# Patient Record
Sex: Female | Born: 2000 | Race: White | Hispanic: No | Marital: Single | Smoking: Never smoker
Health system: Southern US, Community
[De-identification: ages and names within clinical notes are randomized; demographics above are authoritative.]

## PROBLEM LIST (undated history)

## (undated) DIAGNOSIS — F909 Attention-deficit hyperactivity disorder, unspecified type: Secondary | ICD-10-CM

## (undated) DIAGNOSIS — F419 Anxiety disorder, unspecified: Secondary | ICD-10-CM

## (undated) DIAGNOSIS — J45909 Unspecified asthma, uncomplicated: Secondary | ICD-10-CM

## (undated) HISTORY — DX: Unspecified asthma, uncomplicated: J45.909

## (undated) HISTORY — DX: Attention-deficit hyperactivity disorder, unspecified type: F90.9

## (undated) HISTORY — DX: Anxiety disorder, unspecified: F41.9

---
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
## 2021-01-10 ENCOUNTER — Ambulatory Visit: Payer: Self-pay

## 2021-01-27 ENCOUNTER — Ambulatory Visit: Payer: Self-pay

## 2021-03-29 ENCOUNTER — Other Ambulatory Visit: Payer: Self-pay

## 2021-03-29 ENCOUNTER — Encounter: Payer: Self-pay | Admitting: Licensed Clinical Social Worker

## 2021-03-29 ENCOUNTER — Ambulatory Visit (LOCAL_COMMUNITY_HEALTH_CENTER): Payer: Medicaid Other

## 2021-03-29 VITALS — BP 137/76 | Ht 63.5 in | Wt 126.5 lb

## 2021-03-29 DIAGNOSIS — Z3201 Encounter for pregnancy test, result positive: Secondary | ICD-10-CM | POA: Diagnosis not present

## 2021-03-29 LAB — PREGNANCY, URINE: Preg Test, Ur: POSITIVE — AB

## 2021-03-29 MED ORDER — PRENATAL 27-0.8 MG PO TABS: 1.0000 | ORAL_TABLET | Freq: Every day | ORAL | 0 refills | Status: AC

## 2021-03-29 NOTE — Progress Notes (Signed)
UPT positive. Plans prenatal care at [facility], has appt 04/12/2021.  PHQ 15 today. Reports hx depression, anxiety. Denies hurting self/others. Reports in safe situation. Consult with Hazeline Junker, LCSW who made face to face introduction with pt today. Accepted contact info for A. Mariana Kaufman, LCSW. To DSS for Med. Preg W. Jerel Shepherd, RN

## 2021-03-29 NOTE — Progress Notes (Signed)
Patient referred by clinic nurse Miles Costain do to positive PHQ, no SI Hayley Myers request LCSW to introduce herself and explain available services. LCSW met with patient with patient's verbal consent. LCSW shared information about [facility] services and provided patient with contact informaiton. LCSW encouraged patient to call if she desired to schedule an appointment. Patient voiced appreciation.

## 2021-04-05 ENCOUNTER — Ambulatory Visit: Payer: Medicaid Other | Admitting: Licensed Clinical Social Worker

## 2021-04-05 NOTE — Progress Notes (Deleted)
Counselor Initial Adult Exam  Name: Hayley Myers Date: 04/05/2021 MRN: [id number] DOB: [birth year] PCP: Hayley Myers  A biopsychosocial was completed on the Patient. Background information and current concerns were obtained during an intake in the office with the social work intern, Hayley Myers alongside the [facility] Department clinician, Hayley Cosier, LCSW.  Contact information and confidentiality was discussed and appropriate consents were signed.      Reason for Visit /Presenting Problem: ***  Mental Status Exam:   Appearance:   {PSY:22683}     Behavior:  {PSY:21022743}  Motor:  {PSY:22302}  Speech/Language:   {PSY:22685}  Affect:  {PSY:22687}  Mood:  {PSY:31886}  Thought process:  {PSY:31888}  Thought content:    {PSY:[phone]}  Sensory/Perceptual disturbances:    {[phone]}  Orientation:  {PSY:30297}  Attention:  {PSY:22877}  Concentration:  {PSY:360-770-5720}  Memory:  {PSY:330-853-0231}  Fund of knowledge:   {PSY:360-770-5720}  Insight:    {PSY:360-770-5720}  Judgment:   {PSY:360-770-5720}  Impulse Control:  {PSY:360-770-5720}   Reported Symptoms:  {PSY:260-393-6167}  Risk Assessment: Danger to Self:  {PSY:22692} Self-injurious Behavior: {PSY:22692} Danger to Others: {PSY:22692} Duty to Warn:{PSY:311194} Physical Aggression / Violence:{PSY:21197} Access to Firearms a concern: {PSY:21197} Gang Involvement:{PSY:21197} Patient / guardian was educated about steps to take if suicide or homicide risk level increases between visits: {Yes/No-Ex:120004} While future psychiatric events cannot be accurately predicted, the patient does not currently require acute inpatient psychiatric care and does not currently meet [facility] involuntary commitment criteria.  Substance Abuse History: Current substance abuse: {PSY:21197}    Past Psychiatric History:   {Past psych history:20559} Outpatient Providers:*** History of Psych Hospitalization:  {PSY:21197} Psychological Testing: {PSY:21014032}   Abuse History: Victim of {Abuse History:314532}, {Type of abuse:20566}   Report needed: {PSY:314532} Victim of Neglect:{yes no:314532} Perpetrator of {PSY:20566}  Witness / Exposure to Domestic Violence: {PSY:21197}  Protective Services Involvement: {PSY:21197} Witness to [employer] Violence:  {PSY:21197}  Family History:  Family History  Problem Relation Age of Onset  . Heart disease Father   . Hypertension Father   . Heart disease Maternal Grandfather     Social History:  Social History   Socioeconomic History  . Marital status: Single    Spouse name: Not on file  . Number of children: Not on file  . Years of education: Not on file  . Highest education level: Not on file  Occupational History  . Not on file  Tobacco Use  . Smoking status: Never Smoker  . Smokeless tobacco: Never Used  Vaping Use  . Vaping Use: Every day  . Substances: Nicotine  Substance and Sexual Activity  . Alcohol use: Not Currently    Comment: last use- 03/25/21- "cup"   . Drug use: Yes    Types: Marijuana    Comment: once every week.  Marland Kitchen Sexual activity: Yes    Comment: hx depo- wt loss  Other Topics Concern  . Not on file  Social History Narrative   ** Merged History Encounter **       Social Determinants of Health   Financial Resource Strain: Not on file  Food Insecurity: Not on file  Transportation Needs: Not on file  Physical Activity: Not on file  Stress: Not on file  Social Connections: Not on file    Living situation: the patient {lives:315711::"lives with their family"}  Sexual Orientation:  {Sexual Orientation:[phone]}  Relationship Status: {Desc; marital status:62}  Name of spouse / other:***             If  a parent, number of children / ages:***  Support Systems; {DIABETES JQZESPQ:33007}  Financial Stress:  {YES/NO:21197}  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  Educational History: Education: {PSY :31912}  Religion/Sprituality/World View:   {CHL AMB RELIGION/SPIRITUALITY:[phone]}  Any cultural differences that may affect / interfere with treatment:  {Religious/Cultural:200019}  Recreation/Hobbies: {Woc hobbies:30428}  Stressors:{PATIENT STRESSORS:22669}  Strengths:  {Patient Coping Strengths:[phone]}  Barriers:  ***   Legal History: Pending legal issue / charges: {PSY:20588} History of legal issue / charges: {Legal [phone]}  Medical History/Surgical History:{Desc; reviewed/not reviewed:60074} Past Medical History:  Diagnosis Date  . ADHD   . Anxiety   . Asthma     No past surgical history on file.  Medications: Current Outpatient Medications  Medication Sig Dispense Refill  . cephALEXin (KEFLEX) 500 MG capsule Take 1 capsule (500 mg total) by mouth 3 (three) times daily. (Patient not taking: Reported on 03/29/2021) 21 capsule 0  . predniSONE (STERAPRED UNI-PAK 21 TAB) 10 MG (21) TBPK tablet Take 6 pills on day one then decrease by 1 pill each day (Patient not taking: Reported on 03/29/2021) 21 tablet 0  . Prenatal Vit-Fe Fumarate-FA (MULTIVITAMIN-PRENATAL) 27-0.8 MG TABS tablet Take 1 tablet by mouth daily at 12 noon. 100 tablet 0  . traMADol (ULTRAM) 50 MG tablet Take 1 tablet (50 mg total) by mouth every 6 (six) hours as needed. (Patient not taking: Reported on 03/29/2021) 15 tablet 0   No current facility-administered medications for this visit.    Allergies  Allergen Reactions  . Pollen Extract Hives and Shortness Of Breath   Hayley Myers is a 20 y.o. year old female  with a reported history of diagnoses of. Patient currently presents with **** that she reports she has experienced for a 19 time. Patient currently describes both depressive symptoms and anxiety symptoms. She reports significant *** symptoms, including ***. Although patient endorses these vague suicidal ideations, she denies any current  plan, intent, or means to harm herself. She also describes ***. Patient reports that these symptoms significantly impact her functioning in multiple life domains.   Due to the above symptoms and patient's reported history, patient is diagnosed with Major Depressive Disorder, recurrent episode, Moderate and Generalized Anxiety Disorder, With panic attacks. Patient's mood symptoms should continue to be monitored closely to provide further diagnosis clarification. Continued mental health treatment is needed to address patient's symptoms and monitor her safety and stability. Patient is recommended for psychiatric medication management evaluation and continued outpatient therapy to further reduce her symptoms and improve her coping strategies.    There is no acute risk for suicide or violence at this time.  While future psychiatric events cannot be accurately predicted, the patient does not require acute inpatient psychiatric care and does not currently meet [facility] involuntary commitment criteria.      Diagnoses:  No diagnosis found.  Plan of Care: ***  Patient's goal of treatment is   -LCSW provided psychoeducation on -LCSW and patient agreed to develop a treatment plan at next session    Cline Cools

## 2021-04-11 ENCOUNTER — Ambulatory Visit: Payer: Medicaid Other | Admitting: Licensed Clinical Social Worker

## 2021-04-11 ENCOUNTER — Encounter: Payer: Self-pay | Admitting: Licensed Clinical Social Worker

## 2021-04-11 DIAGNOSIS — F4323 Adjustment disorder with mixed anxiety and depressed mood: Secondary | ICD-10-CM
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
## 2021-04-18 ENCOUNTER — Ambulatory Visit: Payer: No Typology Code available for payment source | Admitting: Licensed Clinical Social Worker

## 2021-04-18 DIAGNOSIS — F4323 Adjustment disorder with mixed anxiety and depressed mood: Secondary | ICD-10-CM
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
## 2021-04-24 ENCOUNTER — Ambulatory Visit: Payer: No Typology Code available for payment source | Admitting: Licensed Clinical Social Worker

## 2021-04-24 NOTE — Progress Notes (Unsigned)
Counselor/Therapist Progress Note  Patient ID: ANGALENA COUSINEAU, MRN: [id number],    Date: 04/24/2021  Time Spent: ***   Treatment Type: {CHL AMB THERAPY [phone]}  Reported Symptoms: {CHL AMB Reported Symptoms:[phone]}  Mental Status Exam:  Appearance:   {PSY:22683}     Behavior:  {PSY:21022743}  Motor:  {PSY:22302}  Speech/Language:   {PSY:22685}  Affect:  {PSY:22687}  Mood:  {PSY:31886}  Thought process:  {PSY:31888}  Thought content:    {PSY:442 604 3471}  Sensory/Perceptual disturbances:    {PSY:[phone]}  Orientation:  {PSY:30297}  Attention:  {PSY:22877}  Concentration:  {[phone]}  Memory:  {[phone]}  Fund of knowledge:   {[phone]}  Insight:    {[phone]}  Judgment:   {[phone]}  Impulse Control:  {[phone]}   Risk Assessment: Danger to Self:  {PSY:22692} Self-injurious Behavior: {PSY:22692} Danger to Others: {PSY:22692} Duty to Warn:{PSY:311194} Physical Aggression / Violence:{PSY:21197} Access to Firearms a concern: {PSY:21197} Gang Involvement:{PSY:21197}  Subjective: Patient was engaged and cooperative throughout the session using time effectively to discuss   Patient voices continued motivation for treatment and understanding of  . Patient is likely to benefit from future treatment because  remains motivated to decrease  And   and reports benefit of regular sessions in addressing these symptoms.   Interventions: {[phone]} Established psychological safety.    Diagnosis:   ICD-10-CM   1. Adjustment disorder with mixed anxiety and depressed mood  F43.23     Plan: Patient's goal of treatment is to be less depressed, less anxiety, to set goals in life. Treatment Target: Understand the relationship between thoughts, emotions, and behaviors   Psychoeducation on CBT model    Teach the connection between thoughts, emotions, and behaviors   Enhance emotional awareness and discrimination of emotions   Treatment Target: Increase realistic balanced thinking   Explore patient's thoughts, beliefs, automatic thoughts, assumptions   Identify hot thoughts(upsetting ideas, self-talk and mental images)  Identify and replace thinking that leads to depression  Process distress and allow for emotional release   Cognitive reframing   Questioning and challenging thoughts  Provided psychoeducation on core beliefs, explore, and assist patient in identifying core beliefs and Modify underlying beliefs  Treatment Target: Reducing vulnerability to "emotional mind" and Increase emotional regulation   Values clarification    Self-care - nutrition, sleep, exercise   Increase positive events  ? Activity planning   Coping skills to manage anxiety    Teach distress tolerance techniques - "what helps me"  Treatment Target: Increase awareness of healthy relationships  Provide psychoeducation on healthy relationships   Assist patient in identifying what a healthy relationship is   Future Appointments  Date Time Provider Department Center  04/24/2021  3:00 PM Kathreen Cosier, LCSW [facility] None   Kathreen Cosier, LCSW

## 2021-05-11 LAB — OB RESULTS CONSOLE RUBELLA ANTIBODY, IGM: Rubella: IMMUNE

## 2021-05-11 LAB — OB RESULTS CONSOLE HEPATITIS B SURFACE ANTIGEN: Hepatitis B Surface Ag: NEGATIVE

## 2021-05-11 LAB — OB RESULTS CONSOLE VARICELLA ZOSTER ANTIBODY, IGG: Varicella: IMMUNE

## 2021-06-14 LAB — OB RESULTS CONSOLE GBS: GBS: NEGATIVE

## 2021-07-12 ENCOUNTER — Other Ambulatory Visit: Payer: Self-pay

## 2021-07-12 ENCOUNTER — Encounter: Payer: Self-pay | Admitting: Obstetrics and Gynecology

## 2021-07-12 ENCOUNTER — Observation Stay
Admission: EM | Admit: 2021-07-12 | Discharge: 2021-07-12 | Disposition: A | Discharge status: Home / Self Care | Payer: Medicaid Other | Attending: Obstetrics and Gynecology | Admitting: Obstetrics and Gynecology

## 2021-07-12 DIAGNOSIS — Z3490 Encounter for supervision of normal pregnancy, unspecified, unspecified trimester: Secondary | ICD-10-CM

## 2021-07-12 DIAGNOSIS — Z3A22 22 weeks gestation of pregnancy: Secondary | ICD-10-CM | POA: Diagnosis not present

## 2021-07-12 DIAGNOSIS — O99512 Diseases of the respiratory system complicating pregnancy, second trimester: Secondary | ICD-10-CM | POA: Diagnosis not present

## 2021-07-12 DIAGNOSIS — J45909 Unspecified asthma, uncomplicated: Secondary | ICD-10-CM | POA: Diagnosis not present

## 2021-07-12 LAB — URINALYSIS, COMPLETE (UACMP) WITH MICROSCOPIC
Bilirubin Urine: NEGATIVE
Glucose, UA: NEGATIVE mg/dL
Hgb urine dipstick: NEGATIVE
Ketones, ur: NEGATIVE mg/dL
Nitrite: NEGATIVE
Protein, ur: NEGATIVE mg/dL
Specific Gravity, Urine: 1.013 (ref 1.005–1.030)
pH: 6 (ref 5.0–8.0)

## 2021-07-12 LAB — WET PREP, GENITAL
Clue Cells Wet Prep HPF POC: NONE SEEN
Sperm: NONE SEEN
Trich, Wet Prep: NONE SEEN

## 2021-07-12 MED ORDER — CLOTRIMAZOLE 1 % VA CREA
1.0000 | TOPICAL_CREAM | Freq: Every day | VAGINAL | Status: DC
  Filled 2021-07-12: qty 45

## 2021-07-12 MED ORDER — FLUCONAZOLE 50 MG PO TABS: 150.0000 mg | ORAL_TABLET | Freq: Once | ORAL | Status: DC

## 2021-07-12 NOTE — OB Triage Note (Signed)
Pt. reports to Labor & Delivery for lower right abdominal pain since late this afternoon. She describes the pain as cramping and rates it a 10/10. She also complains of nausea and diarrhea since this morning, as well as being more hot-natured. Vital signs WNL. FHT obtained at 143bpm. Will continue to assess.

## 2021-07-12 NOTE — Discharge Summary (Signed)
TRIAGE NOTE to rule out Preterm Labor   History of Present Illness: Hayley Myers is a 20 y.o. G1P0000 at [redacted]w[redacted]d presenting to triage for preterm pain.  RLQ pain 5/10-10/10 when it happens, with nausea- no vomiting- and watery diarrhea this morning but not since. No fever. No new contacts or ingestions.  Good fetal movement No vaginal discharge or fluid She wouldn't have come but her grandmother wanted her to be checked out  Patient Active Problem List   Diagnosis Date Noted   Asthma 01/14/2019   History of Kawasaki's disease 10/12/2015    Past Medical History:  Diagnosis Date   ADHD    Anxiety    Asthma     History reviewed. No pertinent surgical history.  OB History  Gravida Para Term Preterm AB Living  1 0 0 0 0    SAB IAB Ectopic Multiple Live Births  0 0 0        # Outcome Date GA Lbr Len/2nd Weight Sex Delivery Anes PTL Lv  1 Current             Social History   Socioeconomic History   Marital status: Single    Spouse name: Not on file   Number of children: Not on file   Years of education: 9   Highest education level: 9th grade  Occupational History   Not on file  Tobacco Use   Smoking status: Never   Smokeless tobacco: Never  Vaping Use   Vaping Use: Every day  Substance and Sexual Activity   Alcohol use: Not Currently    Comment: last use- 03/25/21- "cup"    Drug use: Not Currently    Types: Marijuana    Comment: last use 2 years ago    Sexual activity: Yes    Birth control/protection: Injection    Comment: hx depo- wt loss  Other Topics Concern   Not on file  Social History Narrative   Patient is currently pregnant with EDD 10/2021. She reports living in between her maternal grandmother and father of boyfriend. She reports lack of family and social support and a history of childhood instability.        Social Determinants of Health   Financial Resource Strain: Not on file  Food Insecurity: Not on file  Transportation Needs: Not on file   Physical Activity: Not on file  Stress: Not on file  Social Connections: Not on file    Family History  Problem Relation Age of Onset   Heart disease Father    Hypertension Father    Heart disease Maternal Grandfather     Allergies  Allergen Reactions   Pollen Extract Hives and Shortness Of Breath    Medications Prior to Admission  Medication Sig Dispense Refill Last Dose   cephALEXin (KEFLEX) 500 MG capsule Take 1 capsule (500 mg total) by mouth 3 (three) times daily. (Patient not taking: Reported on 03/29/2021) 21 capsule 0    predniSONE (STERAPRED UNI-PAK 21 TAB) 10 MG (21) TBPK tablet Take 6 pills on day one then decrease by 1 pill each day (Patient not taking: Reported on 03/29/2021) 21 tablet 0    traMADol (ULTRAM) 50 MG tablet Take 1 tablet (50 mg total) by mouth every 6 (six) hours as needed. (Patient not taking: Reported on 03/29/2021) 15 tablet 0     Review of Systems - See HPI for OB specific ROS.   Vitals:  BP 114/66 (BP Location: Left Arm)   Pulse 98  Temp 98.7 F (37.1 C) (Oral)   Resp 15   LMP 02/04/2021  Physical Examination: CONSTITUTIONAL: Well-developed, well-nourished female in no acute distress.  HENT:  Normocephalic, atraumatic EYES: Conjunctivae and EOM are normal. No scleral icterus.  NECK: Normal range of motion, supple, SKIN: Skin is warm and dry. No rash noted. Not diaphoretic. No erythema. No pallor. NEUROLGIC: Alert and oriented to person, place, and time. No gross cranial nerve deficit noted. PSYCHIATRIC: Normal mood and affect. Normal behavior. Normal judgment and thought content. CARDIOVASCULAR: Normal heart rate noted, regular rhythm RESPIRATORY: Effort and breath sounds normal, no problems with respiration noted ABDOMEN: Soft, nontender, nondistended, gravid. No rebound, neg Murphy's sign, fundus NOT tender to palpation. Neg for CVA tenderness. No pain on my exam currently  Cervix: deferred Membranes:intact Fetal Monitoring:Baseline: 143  bpm Tocometer: Flat  Labs:  No results found for this or any previous visit (from the past 24 hour(s)).  Imaging Studies: No results found.   Assessment and Plan: Patient Active Problem List   Diagnosis Date Noted   Asthma 01/14/2019   History of Kawasaki's disease 10/12/2015    1. RLQ pain: DDx appendicitis, BV or yeast, intrauterine infection, gastroenteritis, muscle spasm 2. Wet prep pending, no fever argues against infection. Will get UA, send home with precautions.   Cline Cools, MD, MPH

## 2021-07-12 NOTE — OB Triage Note (Signed)
Patient discharged to home. Pt. Left unit ambulatory with all personal belongings to be driven home by parent in private passenger vehicle. OB triage complete.

## 2021-07-12 NOTE — OB Triage Note (Signed)
Spoke with provider and new orders for patient to pick up monistat from pharmacy of choice and follow up with out patient office if no improvement.

## 2021-07-12 NOTE — OB Triage Note (Signed)
Notified Dr. Dalbert Garnet of results of UA and wet prep. New orders given for one time dose of Diflucan see MAR, for patient to be discharged to home and keep all outpatient appointments and notify for fevers and vomiting. Will notify patient on plan of care.

## 2021-10-17 ENCOUNTER — Other Ambulatory Visit: Payer: Self-pay | Admitting: Certified Nurse Midwife

## 2021-10-17 LAB — OB RESULTS CONSOLE RPR: RPR: NONREACTIVE

## 2021-10-17 LAB — OB RESULTS CONSOLE HIV ANTIBODY (ROUTINE TESTING): HIV: NONREACTIVE

## 2021-10-17 LAB — OB RESULTS CONSOLE GC/CHLAMYDIA
Chlamydia: NEGATIVE
Gonorrhea: NEGATIVE

## 2021-10-17 NOTE — Progress Notes (Signed)
G1P0000 at [redacted]w[redacted]d, LMP of 01/28/2021, not c/w early [place] Scheduled for induction of labor for IUGR on 10/23/2021 @ 0001.   Prenatal provider: [facility] Pregnancy complicated by: IUGR GBS Positive (urine at 19wks) Mental health diagnosis: PTSD, anxiety, depression) Vaping in pregnancy  Prenatal Labs: Blood type/Rh A+  Antibody screen neg  Rubella Immune  Varicella Immune  RPR NR  HBsAg Neg  HIV NR  GC neg  Chlamydia neg  Genetic screening cfDNA negative  1 hour GTT 101  3 hour GTT   GBS Pos in urine    Tdap: 08/17/21 Flu: declined Contraception: TBD Feeding preference: breast  ____ Janyce Llanos, CNM  Certified Nurse Midwife [facility]  Clinic OB/GYN [facility]

## 2021-10-22 DIAGNOSIS — O9081 Anemia of the puerperium: Secondary | ICD-10-CM | POA: Diagnosis not present

## 2021-10-22 DIAGNOSIS — D62 Acute posthemorrhagic anemia: Secondary | ICD-10-CM | POA: Diagnosis not present

## 2021-10-22 DIAGNOSIS — O99824 Streptococcus B carrier state complicating childbirth: Secondary | ICD-10-CM | POA: Diagnosis present

## 2021-10-22 DIAGNOSIS — O9279 Other disorders of lactation: Secondary | ICD-10-CM | POA: Diagnosis not present

## 2021-10-22 DIAGNOSIS — O365930 Maternal care for other known or suspected poor fetal growth, third trimester, not applicable or unspecified: Principal | ICD-10-CM | POA: Diagnosis present

## 2021-10-22 DIAGNOSIS — Z20822 Contact with and (suspected) exposure to covid-19: Secondary | ICD-10-CM | POA: Diagnosis present

## 2021-10-22 DIAGNOSIS — Z3A37 37 weeks gestation of pregnancy: Secondary | ICD-10-CM

## 2021-10-23 ENCOUNTER — Encounter
Admission: EM | Disposition: A | Discharge status: Home / Self Care | Payer: Self-pay | Source: Home / Self Care | Attending: Certified Nurse Midwife

## 2021-10-23 ENCOUNTER — Other Ambulatory Visit: Payer: Self-pay

## 2021-10-23 ENCOUNTER — Inpatient Hospital Stay
Admission: EM | Admit: 2021-10-23 | Discharge: 2021-10-25 | DRG: 806 | Disposition: A | Discharge status: Home / Self Care | Payer: Medicaid Other | Attending: Certified Nurse Midwife | Admitting: Certified Nurse Midwife

## 2021-10-23 ENCOUNTER — Encounter: Payer: Self-pay | Admitting: Obstetrics and Gynecology

## 2021-10-23 ENCOUNTER — Inpatient Hospital Stay: Payer: Medicaid Other | Admitting: Anesthesiology

## 2021-10-23 DIAGNOSIS — O365930 Maternal care for other known or suspected poor fetal growth, third trimester, not applicable or unspecified: Secondary | ICD-10-CM | POA: Diagnosis present

## 2021-10-23 DIAGNOSIS — Z20822 Contact with and (suspected) exposure to covid-19: Secondary | ICD-10-CM | POA: Diagnosis present

## 2021-10-23 DIAGNOSIS — O9279 Other disorders of lactation: Secondary | ICD-10-CM | POA: Diagnosis not present

## 2021-10-23 DIAGNOSIS — Z3490 Encounter for supervision of normal pregnancy, unspecified, unspecified trimester: Secondary | ICD-10-CM | POA: Diagnosis present

## 2021-10-23 DIAGNOSIS — Z3A37 37 weeks gestation of pregnancy: Secondary | ICD-10-CM | POA: Diagnosis not present

## 2021-10-23 DIAGNOSIS — O99824 Streptococcus B carrier state complicating childbirth: Secondary | ICD-10-CM | POA: Diagnosis present

## 2021-10-23 DIAGNOSIS — D62 Acute posthemorrhagic anemia: Secondary | ICD-10-CM | POA: Diagnosis not present

## 2021-10-23 DIAGNOSIS — O9081 Anemia of the puerperium: Secondary | ICD-10-CM | POA: Diagnosis not present

## 2021-10-23 LAB — CBC
HCT: 29.6 % — ABNORMAL LOW (ref 36.0–46.0)
Hemoglobin: 10.4 g/dL — ABNORMAL LOW (ref 12.0–15.0)
MCH: 28.8 pg (ref 26.0–34.0)
MCHC: 35.1 g/dL (ref 30.0–36.0)
MCV: 82 fL (ref 80.0–100.0)
Platelets: 226 10*3/uL (ref 150–400)
RBC: 3.61 MIL/uL — ABNORMAL LOW (ref 3.87–5.11)
RDW: 12.6 % (ref 11.5–15.5)
WBC: 10.3 10*3/uL (ref 4.0–10.5)
nRBC: 0 % (ref 0.0–0.2)

## 2021-10-23 LAB — TYPE AND SCREEN
ABO/RH(D): A POS
Antibody Screen: NEGATIVE

## 2021-10-23 LAB — RESP PANEL BY RT-PCR (FLU A&B, COVID) ARPGX2
Influenza A by PCR: NEGATIVE
Influenza B by PCR: NEGATIVE
SARS Coronavirus 2 by RT PCR: NEGATIVE

## 2021-10-23 LAB — ABO/RH: ABO/RH(D): A POS

## 2021-10-23 LAB — RPR: RPR Ser Ql: NONREACTIVE

## 2021-10-23 SURGERY — Surgical Case

## 2021-10-23 MED ORDER — BUPIVACAINE HCL (PF) 0.25 % IJ SOLN
INTRAMUSCULAR | Status: DC | PRN
  Administered 2021-10-23 (×2): 4 mL via EPIDURAL

## 2021-10-23 MED ORDER — IBUPROFEN 600 MG PO TABS
600.0000 mg | ORAL_TABLET | Freq: Four times a day (QID) | ORAL | Status: DC
Start: 2021-10-23 — End: 2021-10-25
  Administered 2021-10-23 – 2021-10-25 (×7): 600 mg via ORAL
  Filled 2021-10-23 (×7): qty 1

## 2021-10-23 MED ORDER — SOD CITRATE-CITRIC ACID 500-334 MG/5ML PO SOLN: 30.0000 mL | ORAL | Status: DC | PRN

## 2021-10-23 MED ORDER — ZOLPIDEM TARTRATE 5 MG PO TABS: 5.0000 mg | ORAL_TABLET | Freq: Every evening | ORAL | Status: DC | PRN

## 2021-10-23 MED ORDER — PHENYLEPHRINE 40 MCG/ML (10ML) SYRINGE FOR IV PUSH (FOR BLOOD PRESSURE SUPPORT): 80.0000 ug | PREFILLED_SYRINGE | INTRAVENOUS | Status: DC | PRN

## 2021-10-23 MED ORDER — PENICILLIN G POT IN DEXTROSE 60000 UNIT/ML IV SOLN
3.0000 10*6.[IU] | INTRAVENOUS | Status: DC
  Administered 2021-10-23: 3 10*6.[IU] via INTRAVENOUS
  Filled 2021-10-23: qty 50

## 2021-10-23 MED ORDER — FENTANYL-BUPIVACAINE-NACL 0.5-0.125-0.9 MG/250ML-% EP SOLN
12.0000 mL/h | EPIDURAL | Status: DC | PRN
  Administered 2021-10-23: 12 mL/h via EPIDURAL

## 2021-10-23 MED ORDER — AMMONIA AROMATIC IN INHA
RESPIRATORY_TRACT | Status: AC
  Filled 2021-10-23: qty 10

## 2021-10-23 MED ORDER — ONDANSETRON HCL 4 MG/2ML IJ SOLN: 4.0000 mg | INTRAMUSCULAR | Status: DC | PRN

## 2021-10-23 MED ORDER — LIDOCAINE HCL (PF) 1 % IJ SOLN
INTRAMUSCULAR | Status: AC
  Filled 2021-10-23: qty 30

## 2021-10-23 MED ORDER — OXYTOCIN-SODIUM CHLORIDE 30-0.9 UT/500ML-% IV SOLN: 1.0000 m[IU]/min | INTRAVENOUS | Status: DC

## 2021-10-23 MED ORDER — EPHEDRINE 5 MG/ML INJ: 10.0000 mg | INTRAVENOUS | Status: DC | PRN

## 2021-10-23 MED ORDER — OXYTOCIN BOLUS FROM INFUSION
333.0000 mL | Freq: Once | INTRAVENOUS | Status: AC
  Administered 2021-10-23: 333 mL via INTRAVENOUS

## 2021-10-23 MED ORDER — WITCH HAZEL-GLYCERIN EX PADS: 1.0000 "application " | MEDICATED_PAD | CUTANEOUS | Status: DC | PRN

## 2021-10-23 MED ORDER — TERBUTALINE SULFATE 1 MG/ML IJ SOLN: 0.2500 mg | Freq: Once | INTRAMUSCULAR | Status: DC | PRN

## 2021-10-23 MED ORDER — MISOPROSTOL 200 MCG PO TABS
ORAL_TABLET | ORAL | Status: AC
  Filled 2021-10-23: qty 4

## 2021-10-23 MED ORDER — DIPHENHYDRAMINE HCL 25 MG PO CAPS: 25.0000 mg | ORAL_CAPSULE | Freq: Four times a day (QID) | ORAL | Status: DC | PRN

## 2021-10-23 MED ORDER — OXYTOCIN-SODIUM CHLORIDE 30-0.9 UT/500ML-% IV SOLN
2.5000 [IU]/h | INTRAVENOUS | Status: DC
  Administered 2021-10-23: 2.5 [IU]/h via INTRAVENOUS
  Filled 2021-10-23: qty 500

## 2021-10-23 MED ORDER — LIDOCAINE HCL (PF) 1 % IJ SOLN
30.0000 mL | INTRAMUSCULAR | Status: DC | PRN
Start: 2021-10-23 — End: 2021-10-23

## 2021-10-23 MED ORDER — OXYTOCIN 10 UNIT/ML IJ SOLN
INTRAMUSCULAR | Status: AC
  Filled 2021-10-23: qty 2

## 2021-10-23 MED ORDER — ACETAMINOPHEN 325 MG PO TABS: 650.0000 mg | ORAL_TABLET | ORAL | Status: DC | PRN

## 2021-10-23 MED ORDER — PENICILLIN G POT IN DEXTROSE 60000 UNIT/ML IV SOLN: 3.0000 10*6.[IU] | INTRAVENOUS | Status: DC

## 2021-10-23 MED ORDER — FENTANYL-BUPIVACAINE-NACL 0.5-0.125-0.9 MG/250ML-% EP SOLN
EPIDURAL | Status: AC
  Filled 2021-10-23: qty 250

## 2021-10-23 MED ORDER — BENZOCAINE-MENTHOL 20-0.5 % EX AERO
1.0000 | INHALATION_SPRAY | CUTANEOUS | Status: DC | PRN
Start: 2021-10-23 — End: 2021-10-25

## 2021-10-23 MED ORDER — SODIUM CHLORIDE 0.9 % IV SOLN
5.0000 10*6.[IU] | Freq: Once | INTRAVENOUS | Status: AC
  Administered 2021-10-23: 5 10*6.[IU] via INTRAVENOUS
  Filled 2021-10-23: qty 5

## 2021-10-23 MED ORDER — SIMETHICONE 80 MG PO CHEW: 80.0000 mg | CHEWABLE_TABLET | ORAL | Status: DC | PRN

## 2021-10-23 MED ORDER — LIDOCAINE-EPINEPHRINE (PF) 1.5 %-1:200000 IJ SOLN
INTRAMUSCULAR | Status: DC | PRN
  Administered 2021-10-23: 3 mL via PERINEURAL

## 2021-10-23 MED ORDER — LACTATED RINGERS IV SOLN: 500.0000 mL | INTRAVENOUS | Status: DC | PRN

## 2021-10-23 MED ORDER — LACTATED RINGERS IV SOLN
500.0000 mL | Freq: Once | INTRAVENOUS | Status: AC
  Administered 2021-10-23: 500 mL via INTRAVENOUS

## 2021-10-23 MED ORDER — ACETAMINOPHEN 325 MG PO TABS
650.0000 mg | ORAL_TABLET | ORAL | Status: DC | PRN
Start: 2021-10-23 — End: 2021-10-23

## 2021-10-23 MED ORDER — TETANUS-DIPHTH-ACELL PERTUSSIS 5-2.5-18.5 LF-MCG/0.5 IM SUSY
0.5000 mL | PREFILLED_SYRINGE | Freq: Once | INTRAMUSCULAR | Status: DC
Start: 2021-10-24 — End: 2021-10-25
  Filled 2021-10-23: qty 0.5

## 2021-10-23 MED ORDER — ONDANSETRON HCL 4 MG/2ML IJ SOLN
4.0000 mg | Freq: Four times a day (QID) | INTRAMUSCULAR | Status: DC | PRN
  Administered 2021-10-23: 4 mg via INTRAVENOUS
  Filled 2021-10-23: qty 2

## 2021-10-23 MED ORDER — SENNOSIDES-DOCUSATE SODIUM 8.6-50 MG PO TABS
2.0000 | ORAL_TABLET | Freq: Every day | ORAL | Status: DC
  Administered 2021-10-24: 2 via ORAL
  Filled 2021-10-23: qty 2

## 2021-10-23 MED ORDER — MISOPROSTOL 25 MCG QUARTER TABLET
25.0000 ug | ORAL_TABLET | ORAL | Status: DC | PRN
  Administered 2021-10-23 (×2): 25 ug via VAGINAL
  Filled 2021-10-23 (×3): qty 1

## 2021-10-23 MED ORDER — PRENATAL MULTIVITAMIN CH
1.0000 | ORAL_TABLET | Freq: Every day | ORAL | Status: DC
  Administered 2021-10-24: 1 via ORAL
  Filled 2021-10-23: qty 1

## 2021-10-23 MED ORDER — SODIUM CHLORIDE 0.9 % IV SOLN: 5.0000 10*6.[IU] | Freq: Once | INTRAVENOUS | Status: DC

## 2021-10-23 MED ORDER — LIDOCAINE HCL (PF) 1 % IJ SOLN
INTRAMUSCULAR | Status: DC | PRN
  Administered 2021-10-23: 3 mL

## 2021-10-23 MED ORDER — ONDANSETRON HCL 4 MG PO TABS
4.0000 mg | ORAL_TABLET | ORAL | Status: DC | PRN
  Filled 2021-10-23 (×2): qty 1

## 2021-10-23 MED ORDER — FENTANYL CITRATE (PF) 100 MCG/2ML IJ SOLN
50.0000 ug | INTRAMUSCULAR | Status: DC | PRN
Start: 2021-10-23 — End: 2021-10-23

## 2021-10-23 MED ORDER — LACTATED RINGERS IV SOLN: INTRAVENOUS | Status: DC

## 2021-10-23 MED ORDER — DIPHENHYDRAMINE HCL 50 MG/ML IJ SOLN: 12.5000 mg | INTRAMUSCULAR | Status: DC | PRN

## 2021-10-23 MED ORDER — COCONUT OIL OIL
1.0000 "application " | TOPICAL_OIL | Status: DC | PRN
  Filled 2021-10-23: qty 120

## 2021-10-23 MED ORDER — DIBUCAINE (PERIANAL) 1 % EX OINT: 1.0000 "application " | TOPICAL_OINTMENT | CUTANEOUS | Status: DC | PRN

## 2021-10-23 MED ORDER — PRENATAL MULTIVITAMIN CH: 1.0000 | ORAL_TABLET | Freq: Every day | ORAL | Status: DC

## 2021-10-23 SURGICAL SUPPLY — 26 items
CHLORAPREP W/TINT 26 (MISCELLANEOUS) ×4 IMPLANT
DERMABOND ADVANCED (GAUZE/BANDAGES/DRESSINGS) ×1
DERMABOND ADVANCED .7 DNX12 (GAUZE/BANDAGES/DRESSINGS) ×1 IMPLANT
DRSG OPSITE POSTOP 4X10 (GAUZE/BANDAGES/DRESSINGS) ×2 IMPLANT
ELECT CAUTERY BLADE 6.4 (BLADE) ×2 IMPLANT
ELECT REM PT RETURN 9FT ADLT (ELECTROSURGICAL) ×2
ELECTRODE REM PT RTRN 9FT ADLT (ELECTROSURGICAL) ×1 IMPLANT
GLOVE SURG UNDER POLY LF SZ6.5 (GLOVE) ×4 IMPLANT
GOWN STRL REUS W/ TWL LRG LVL3 (GOWN DISPOSABLE) ×1 IMPLANT
GOWN STRL REUS W/ TWL XL LVL3 (GOWN DISPOSABLE) ×2 IMPLANT
GOWN STRL REUS W/TWL LRG LVL3 (GOWN DISPOSABLE) ×1
GOWN STRL REUS W/TWL XL LVL3 (GOWN DISPOSABLE) ×2
MANIFOLD NEPTUNE II (INSTRUMENTS) ×2 IMPLANT
MAT PREVALON FULL STRYKER (MISCELLANEOUS) ×2 IMPLANT
NS IRRIG 1000ML POUR BTL (IV SOLUTION) ×2 IMPLANT
PACK C SECTION AR (MISCELLANEOUS) ×2 IMPLANT
PAD OB MATERNITY 4.3X12.25 (PERSONAL CARE ITEMS) ×2 IMPLANT
PAD PREP 24X41 OB/GYN DISP (PERSONAL CARE ITEMS) ×2 IMPLANT
PENCIL SMOKE EVACUATOR (MISCELLANEOUS) ×2 IMPLANT
SCRUB EXIDINE 4% CHG 4OZ (MISCELLANEOUS) ×2 IMPLANT
SUT MNCRL AB 4-0 PS2 18 (SUTURE) ×2 IMPLANT
SUT PLAIN 3-0 (SUTURE) ×2 IMPLANT
SUT VIC AB 0 CT1 36 (SUTURE) ×6 IMPLANT
SUT VIC AB 2-0 CT1 36 (SUTURE) ×2 IMPLANT
SYR 30ML LL (SYRINGE) ×4 IMPLANT
WATER STERILE IRR 500ML POUR (IV SOLUTION) ×2 IMPLANT

## 2021-10-23 NOTE — Lactation Note (Signed)
This note was copied from a baby's chart. Lactation Consultation Note  Patient Name: Hayley Myers SEGBT'D Date: 10/23/2021 Reason for consult: L&D Initial assessment;Primapara;Early term 37-38.6wks;Infant < 6lbs;Other (Comment) (IUGR) Age:19 hours  Initial lactation visit in LDR2. Mom is a P1 20yo induced for IUGR at 37 weeks.  Mom indicated that she wanted to breastfeed at admission.  First attempt: baby is skin to skin with mom, mom open to attempting to feed and notes that her R breast expressed milk when she was pumping (pre-delivery). Pillow placed for support of football hold at R breast. LC assisted with moving baby into position and sandwiching of the breast tissue. -Of note: Mom has nipples that appear inverted at rest, do evert with stimulation, but with compression flatten and invert again. We are able to hand express colostrum fairly easy. Baby had a difficult time sustaining latch, but was able to lick the colostrum. LC discussed with mom the benefit of trying a nipple shield for structure and stimulation within the baby's mouth.  LC returned 15-20 minutes later with nipple shield- a few attempts made to latch baby before mom voiced "that's it, I'm done, I tried", and pushed LC and shield away. Mom voiced desire for formula at this time.  LC updated L&D RN, unable to locate transition RN at this time.  Maternal Data Has patient been taught Hand Expression?: Yes Does the patient have breastfeeding experience prior to this delivery?: No  Feeding Mother's Current Feeding Choice: Breast Milk  LATCH Score Latch: Repeated attempts needed to sustain latch, nipple held in mouth throughout feeding, stimulation needed to elicit sucking reflex.  Audible Swallowing: None  Type of Nipple: Flat  Comfort (Breast/Nipple): Soft / non-tender  Hold (Positioning): Full assist, staff holds infant at breast  LATCH Score: 4   Lactation Tools Discussed/Used     Interventions Interventions: Breast feeding basics reviewed;Assisted with latch;Skin to skin;Hand express;Adjust position;Support pillows;Position options;Education (nipple shield)  Discharge    Consult Status Consult Status: Follow-up Date: 10/24/21 Follow-up type: Call as needed    Danford Bad 10/23/2021, 5:14 PM

## 2021-10-23 NOTE — H&P (Signed)
OB History & Physical   History of Present Illness:  Chief Complaint: scheduled IOL  HPI:  Hayley Myers is a 20 y.o. G1P0000 female at 102w2d dated by [place].  She presents to L&D for induction of labor for IUGR on 10/23/2021 @ 0001.  Active FM, feeling mild cramping, denies LOF and VB    Pregnancy Issues: IUGR GBS Positive (urine at 19wks) Mental health diagnosis: PTSD, anxiety, depression) Vaping in pregnancy   Maternal Medical History:   Past Medical History:  Diagnosis Date   ADHD    Anxiety    Asthma     History reviewed. No pertinent surgical history.  Allergies  Allergen Reactions   Pollen Extract Hives and Shortness Of Breath    Prior to Admission medications   Medication Sig Start Date End Date Taking? Authorizing Provider  Prenatal Vit-Fe Fumarate-FA (PRENATAL MULTIVITAMIN) TABS tablet Take 1 tablet by mouth daily at 12 noon.   Yes [provider]  cephALEXin (KEFLEX) 500 MG capsule Take 1 capsule (500 mg total) by mouth 3 (three) times daily. Patient not taking: No sig reported 06/29/20   Faythe Ghee, PA-C  predniSONE (STERAPRED UNI-PAK 21 TAB) 10 MG (21) TBPK tablet Take 6 pills on day one then decrease by 1 pill each day Patient not taking: No sig reported 06/29/20   Faythe Ghee, PA-C  traMADol (ULTRAM) 50 MG tablet Take 1 tablet (50 mg total) by mouth every 6 (six) hours as needed. Patient not taking: No sig reported 06/29/20   Faythe Ghee, PA-C     Prenatal care site: [facility]   Social History: She  reports that she has never smoked. She has never used smokeless tobacco. She reports that she does not currently use alcohol. She reports that she does not currently use drugs after having used the following drugs: Marijuana.  Family History: family history includes Heart disease in her father and maternal grandfather; Hypertension in her father.   Review of Systems: A full review of systems was performed and negative except as  noted in the HPI.     Physical Exam:  Vital Signs: BP 124/80 (BP Location: Left Arm)   Pulse (!) 110   Temp 98.2 F (36.8 C) (Oral)   Resp 16   Ht 5\' 3"  (1.6 m)   Wt 65.3 kg   LMP 02/04/2021   BMI 25.51 kg/m  General: no acute distress.  HEENT: normocephalic, atraumatic Heart: regular rate & rhythm.  No murmurs/rubs/gallops Lungs: clear to auscultation bilaterally, normal respiratory effort Abdomen: soft, gravid, non-tender;  EFW: 5.5lbs  Extremities: non-tender, symmetric, No edema bilaterally.  DTRs: 2+  Neurologic: Alert & oriented x 3.    Results for orders placed or performed during the hospital encounter of 10/23/21 (from the past 24 hour(s))  CBC     Status: Abnormal   Collection Time: 10/23/21 12:30 AM  Result Value Ref Range   WBC 10.3 4.0 - 10.5 K/uL   RBC 3.61 (L) 3.87 - 5.11 MIL/uL   Hemoglobin 10.4 (L) 12.0 - 15.0 g/dL   HCT 10/25/21 (L) 99.8 - 33.8 %   MCV 82.0 80.0 - 100.0 fL   MCH 28.8 26.0 - 34.0 pg   MCHC 35.1 30.0 - 36.0 g/dL   RDW 25.0 53.9 - 76.7 %   Platelets 226 150 - 400 K/uL   nRBC 0.0 0.0 - 0.2 %  Type and screen     Status: None   Collection Time: 10/23/21 12:30  AM  Result Value Ref Range   ABO/RH(D) A POS    Antibody Screen NEG    Sample Expiration      10/26/2021,2359 Performed at [facility], [address] [place], [place] [address]   Resp Panel by RT-PCR (Flu A&B, Covid) Nasopharyngeal Swab     Status: None   Collection Time: 10/23/21 12:35 AM   Specimen: Nasopharyngeal Swab; Nasopharyngeal(NP) swabs in vial transport medium  Result Value Ref Range   SARS Coronavirus 2 by RT PCR NEGATIVE NEGATIVE   Influenza A by PCR NEGATIVE NEGATIVE   Influenza B by PCR NEGATIVE NEGATIVE  ABO/Rh     Status: None   Collection Time: 10/23/21 12:52 AM  Result Value Ref Range   ABO/RH(D)      A POS Performed at [facility], [address] [place], [place] [address]     Pertinent Results:  Prenatal Labs: Blood type/Rh A+   Antibody screen neg  Rubella Immune  Varicella Immune  RPR NR  HBsAg Neg  HIV NR  GC neg  Chlamydia neg  Genetic screening cfDNA negative  1 hour GTT 101  3 hour GTT    GBS Pos in urine    10/17/21: EFW= 5lb6oz(2426g)= <10% FHR=127bpm AFI= 13.72cm @ 50% Placenta= posterior Position=vertex, spine lt  FHT: 125bpm, mod variability, + accels, no decels TOCO: UI noted SVE:  Dilation: Closed / Effacement (%): Thick / Station: Ballotable per RN exam   Cephalic by [employer] and RN SVE  No results found.  Assessment:  Hayley Myers is a 20 y.o. G1P0000 female at [redacted]w[redacted]d with IUGR.   Plan:  1. Admit to Labor & Delivery; consents reviewed and obtained - COVID neg on admit  2. Fetal Well being  - Fetal Tracing: Cat I tracing - Group B Streptococcus ppx indicated: Pos via urine - Presentation: cephalic confirmed by [place] last week, RN exam   3. Routine OB: - Prenatal labs reviewed, as above - Rh A Pos - CBC, T&S, RPR on admit - Clear fluids, IVF  4. Induction of Labor -  Contractions: external toco in place -  Pelvis adequate for TOL -  Plan for induction with cytotec  -  Plan for continuous fetal monitoring  -  Maternal pain control as desired - Anticipate vaginal delivery  5. Post Partum Planning: Tdap: 08/17/21 Flu: declined Contraception: TBD Feeding preference: breast  Randa Ngo, CNM 10/23/21 2:30 AM

## 2021-10-23 NOTE — Progress Notes (Signed)
Labor Progress Note  Hayley Myers is a 20 y.o. G1P0000 at [redacted]w[redacted]d by ultrasound admitted for induction of labor due to Poor fetal growth.  Subjective: feeling mild menstrual like cramps  Objective: BP 124/80 (BP Location: Left Arm)   Pulse (!) 110   Temp 98.2 F (36.8 C) (Oral)   Resp 16   Ht 5\' 3"  (1.6 m)   Wt 65.3 kg   LMP 02/04/2021   BMI 25.51 kg/m  Notable VS details: reviewed  Fetal Assessment: FHT:  FHR: 125 bpm, variability: moderate,  accelerations:  Present,  decelerations:  Absent Category/reactivity:  Category I UC:   irregular, with UI SVE:   1.5/70/-2, soft/posterior Membrane status:intact Amniotic color: n/a  Labs: Lab Results  Component Value Date   WBC 10.3 10/23/2021   HGB 10.4 (L) 10/23/2021   HCT 29.6 (L) 10/23/2021   MCV 82.0 10/23/2021   PLT 226 10/23/2021    Assessment / Plan: G1P0 at [redacted]w[redacted]d, IOL for IUGR  Labor: s/p 1st dose of Cytotec with cervical change.  Preeclampsia:  no e/o pre-E Fetal Wellbeing:  Category I Pain Control:  Labor support without medications I/D:   GBS Pos urine- will need treatment.  Anticipated MOD:  NSVD  [redacted]w[redacted]d, CNM 10/23/2021, 4:53 AM

## 2021-10-23 NOTE — Progress Notes (Signed)
Labor Progress Note  Hayley Myers is a 20 y.o. G1P0000 at [redacted]w[redacted]d by ultrasound admitted for induction of labor due to Poor fetal growth.  Subjective: she is resting comfortably in bed, wants to eat breakfast. Reports occasional cramping  Objective: BP 119/75 (BP Location: Left Arm)   Pulse 87   Temp 98.4 F (36.9 C) (Oral)   Resp 15   Ht 5\' 3"  (1.6 m)   Wt 65.3 kg   LMP 02/04/2021   BMI 25.51 kg/m  Notable VS details: reviewed  Fetal Assessment: FHT:  FHR: 125bpm, variability: moderate, accels: present, decels: absent Category/reactivity:  Category I UC:   irregular SVE:    Dilation: 3cm  Effacement: 80%  Station:  0  Consistency: soft  Position: middle  Membrane status:AROM @ 0849 Amniotic color: clear  Labs: Lab Results  Component Value Date   WBC 10.3 10/23/2021   HGB 10.4 (L) 10/23/2021   HCT 29.6 (L) 10/23/2021   MCV 82.0 10/23/2021   PLT 226 10/23/2021    Assessment / Plan: Induction of labor d/t poor fetal growth, adequate cervical change with cytotec, AROM then starting pitocin.  Labor:  Progressing normally with 2 doses of cytotec. AROM andw ill start pitocin after she eats breakfast Preeclampsia:  no signs or symptoms of toxicity Fetal Wellbeing:  Category I Pain Control:  Labor support without medications I/D:   GBS pos in urine at 19wks (swab at 36wks was negative). Start antibiotics.  No s/s infection. Afebrile, FHT category I. Anticipated MOD:  NSVD  10/25/2021, CNM 10/23/2021, 8:58 AM

## 2021-10-23 NOTE — Anesthesia Preprocedure Evaluation (Signed)
Anesthesia Evaluation  Patient identified by MRN, date of birth, ID band Patient awake    Reviewed: Allergy & Precautions, H&P , NPO status , Patient's Chart, lab work & pertinent test results  Airway Mallampati: II       Dental no notable dental hx.    Pulmonary asthma ,           Cardiovascular negative cardio ROS       Neuro/Psych Anxiety negative neurological ROS     GI/Hepatic Neg liver ROS, GERD  ,  Endo/Other  negative endocrine ROS  Renal/GU negative Renal ROS  negative genitourinary   Musculoskeletal   Abdominal   Peds  Hematology negative hematology ROS (+)   Anesthesia Other Findings   Reproductive/Obstetrics (+) Pregnancy                             Anesthesia Physical Anesthesia Plan  ASA: 2  Anesthesia Plan: Epidural   Post-op Pain Management:    Induction:   PONV Risk Score and Plan:   Airway Management Planned:   Additional Equipment:   Intra-op Plan:   Post-operative Plan:   Informed Consent: I have reviewed the patients History and Physical, chart, labs and discussed the procedure including the risks, benefits and alternatives for the proposed anesthesia with the patient or authorized representative who has indicated his/her understanding and acceptance.     Dental Advisory Given  Plan Discussed with: Anesthesiologist and CRNA  Anesthesia Plan Comments:         Anesthesia Quick Evaluation

## 2021-10-23 NOTE — Discharge Summary (Signed)
Obstetrical Discharge Summary  Patient Name: Hayley Myers DOB: 08-12-2001 MRN: [id number]  Date of Admission: 10/23/2021 Date of Delivery: 10/23/2021 Delivered by: Hayley Myers, CNM Date of Discharge: 10/25/2021  [facility]  [id number]'T last menstrual period was 02/04/2021. EDC Estimated Date of Delivery: 11/11/21 Gestational Age at Delivery: [redacted]w[redacted]d   Antepartum complications:  IUGR GBS Positive (urine at 19wks) Mental health diagnosis: PTSD, anxiety, depression) Vaping in pregnancy Admitting Diagnosis: IOL for fetal growth restriction Secondary Diagnosis: Patient Active Problem List   Diagnosis Date Noted   NSVD (normal spontaneous vaginal delivery) 10/25/2021   Asthma 01/14/2019   History of Kawasaki's disease 10/12/2015    Augmentation: Cytotec Complications: None Intrapartum complications/course: see delivery note Date of Delivery: 10/23/2021 Delivered By: Hayley Myers, CNM Delivery Type: spontaneous vaginal delivery Anesthesia: epidural Placenta: spontaneous Laceration: none Episiotomy: none Newborn Data: Live born female  Birth Weight: 5 lb 1.1 oz (2300 g) APGAR: 8, 9  Newborn Delivery   Birth date/time: 10/23/2021 16:09:00 Delivery type: Vaginal, Spontaneous        Postpartum Procedures: none Edinburgh:  Edinburgh Postnatal Depression Scale Screening Tool 10/24/2021 10/23/2021  I have been able to laugh and see the funny side of things. 0 (No Data)  I have looked forward with enjoyment to things. 0 -  I have blamed myself unnecessarily when things went wrong. 0 -  I have been anxious or worried for no good reason. 0 -  I have felt scared or panicky for no good reason. 0 -  Things have been getting on top of me. 0 -  I have been so unhappy that I have had difficulty sleeping. 0 -  I have felt sad or miserable. 0 -  I have been so unhappy that I have been crying. 0 -  The thought of harming myself has occurred to me. 0 -   Edinburgh Postnatal Depression Scale Total 0 -      Post partum course:  Patient had an uncomplicated postpartum course.  By time of discharge on PPD#2, her pain was controlled on oral pain medications; she had appropriate lochia and was ambulating, voiding without difficulty and tolerating regular diet.  She was deemed stable for discharge to home.    Discharge Physical Exam:  BP 107/60   Pulse 74   Temp 97.9 F (36.6 C) (Oral)   Resp 20   Ht 5\' 3"  (1.6 m)   Wt 65.3 kg   LMP 02/04/2021   SpO2 100%   Breastfeeding Unknown   BMI 25.51 kg/m   General: NAD CV: RRR Pulm: CTABL, nl effort ABD: s/nd/nt, fundus firm and below the umbilicus Lochia: moderate Perineum: well approximated/intact DVT Evaluation: LE non-ttp, no evidence of DVT on exam.  Hemoglobin  Date Value Ref Range Status  10/24/2021 10.0 (L) 12.0 - 15.0 g/dL Final   HCT  Date Value Ref Range Status  10/24/2021 29.6 (L) 36.0 - 46.0 % Final     Disposition: stable, discharge to home. Baby Feeding: breastmilk Baby Disposition: home with mom  Rh Immune globulin given: A pos Rubella vaccine given: immune Varicella vaccine given: immune Tdap vaccine given in AP or PP setting: 05/17/21 Flu vaccine given in AP or PP setting: declined  Contraception: TBD  Prenatal Labs:  Blood type/Rh A+  Antibody screen neg  Rubella Immune  Varicella Immune  RPR NR  HBsAg Neg  HIV NR  GC neg  Chlamydia neg  Genetic screening cfDNA negative  1 hour GTT 101  3 hour GTT    GBS Pos in urine      Plan:  Hayley Myers was discharged to home in good condition. Follow-up appointment with delivering provider in 6 weeks.  Discharge Medications: Allergies as of 10/25/2021       Reactions   Pollen Extract Hives, Shortness Of Breath        Medication List     STOP taking these medications    cephALEXin 500 MG capsule Commonly known as: KEFLEX   predniSONE 10 MG (21) Tbpk tablet Commonly known as: STERAPRED  UNI-PAK 21 TAB   traMADol 50 MG tablet Commonly known as: ULTRAM       TAKE these medications    prenatal multivitamin Tabs tablet Take 1 tablet by mouth daily at 12 noon.         Follow-up Information     Hayley Myers, CNM. Schedule an appointment as soon as possible for a visit in 6 week(s).   Specialty: Certified Nurse Midwife Contact information: [address] [place] [address] [phone]                 Signed:  Gregery Myers 10/25/2021  8:48 AM

## 2021-10-23 NOTE — Anesthesia Procedure Notes (Addendum)
Epidural Patient location during procedure: OB Start time: 10/23/2021 10:15 AM End time: 10/23/2021 10:30 AM  Staffing Anesthesiologist: Lenard Simmer, MD Resident/CRNA: Irving Burton, CRNA Performed: resident/CRNA   Preanesthetic Checklist Completed: patient identified, IV checked, site marked, risks and benefits discussed, surgical consent, monitors and equipment checked, pre-op evaluation and timeout performed  Epidural Patient position: sitting Prep: ChloraPrep Patient monitoring: heart rate, continuous pulse ox and blood pressure Approach: midline Location: L3-L4 Injection technique: LOR saline  Needle:  Needle type: Tuohy  Needle gauge: 17 G Needle length: 9 cm and 9 Needle insertion depth: 6 cm Catheter type: closed end flexible Catheter size: 19 Gauge Catheter at skin depth: 10 cm Test dose: negative and 1.5% lidocaine with Epi 1:200 K  Assessment Sensory level: T10 Events: blood not aspirated, injection not painful, no injection resistance, no paresthesia and negative IV test  Additional Notes 1 attempt Pt. Evaluated and documentation done after procedure finished. Patient identified. Risks/Benefits/Options discussed with patient including but not limited to bleeding, infection, nerve damage, paralysis, failed block, incomplete pain control, headache, blood pressure changes, nausea, vomiting, reactions to medication both or allergic, itching and postpartum back pain. Confirmed with bedside nurse the patient's most recent platelet count. Confirmed with patient that they are not currently taking any anticoagulation, have any bleeding history or any family history of bleeding disorders. Patient expressed understanding and wished to proceed. All questions were answered. Sterile technique was used throughout the entire procedure. Please see nursing notes for vital signs. Test dose was given through epidural catheter and negative prior to continuing to dose epidural or  start infusion. Warning signs of high block given to the patient including shortness of breath, tingling/numbness in hands, complete motor block, or any concerning symptoms with instructions to call for help. Patient was given instructions on fall risk and not to get out of bed. All questions and concerns addressed with instructions to call with any issues or inadequate analgesia.    Patient tolerated the insertion well without immediate complications.Reason for block:procedure for pain

## 2021-10-24 LAB — CBC
HCT: 29.6 % — ABNORMAL LOW (ref 36.0–46.0)
Hemoglobin: 10 g/dL — ABNORMAL LOW (ref 12.0–15.0)
MCH: 27.6 pg (ref 26.0–34.0)
MCHC: 33.8 g/dL (ref 30.0–36.0)
MCV: 81.8 fL (ref 80.0–100.0)
Platelets: 174 10*3/uL (ref 150–400)
RBC: 3.62 MIL/uL — ABNORMAL LOW (ref 3.87–5.11)
RDW: 12.8 % (ref 11.5–15.5)
WBC: 11.4 10*3/uL — ABNORMAL HIGH (ref 4.0–10.5)
nRBC: 0 % (ref 0.0–0.2)

## 2021-10-24 MED ORDER — FERROUS SULFATE 325 (65 FE) MG PO TABS
325.0000 mg | ORAL_TABLET | Freq: Two times a day (BID) | ORAL | Status: DC
  Administered 2021-10-25: 325 mg via ORAL
  Filled 2021-10-24: qty 1

## 2021-10-24 NOTE — Anesthesia Postprocedure Evaluation (Signed)
Anesthesia Post Note  Patient: Hayley Myers  Procedure(s) Performed: AN AD HOC LABOR EPIDURAL  Patient location during evaluation: Mother Baby Anesthesia Type: Epidural Level of consciousness: awake and alert Pain management: pain level controlled Vital Signs Assessment: post-procedure vital signs reviewed and stable Respiratory status: spontaneous breathing, nonlabored ventilation and respiratory function stable Cardiovascular status: stable Postop Assessment: no headache, no backache and epidural receding Anesthetic complications: no   No notable events documented.   Last Vitals:  Vitals:   10/24/21 1152 10/24/21 1530  BP: 112/67 (!) 105/58  Pulse: 72 86  Resp: 18 20  Temp: 36.9 C 37 C  SpO2: 95% 100%    Last Pain:  Vitals:   10/24/21 1530  TempSrc: Oral  PainSc:                  Karoline Caldwell

## 2021-10-24 NOTE — Progress Notes (Signed)
Postpartum Day  1  Subjective: no complaints, up ad lib, voiding, and tolerating PO  Doing well, no concerns. Ambulating without difficulty, pain managed with PO meds, tolerating regular diet, and voiding without difficulty.   No fever/chills, chest pain, shortness of breath, nausea/vomiting, or leg pain. No nipple or breast pain. No headache, visual changes, or RUQ/epigastric pain.  Objective: BP (!) 112/51 (BP Location: Right Arm)   Pulse 72   Temp 98.3 F (36.8 C) (Oral)   Resp 20   Ht 5\' 3"  (1.6 m)   Wt 65.3 kg   LMP 02/04/2021   SpO2 99%   Breastfeeding Unknown   BMI 25.51 kg/m    Physical Exam:  General: alert, cooperative, and no distress Breasts: soft/nontender CV: RRR Pulm: nl effort, CTABL Abdomen: soft, non-tender, active bowel sounds Uterine Fundus: firm Perineum: minimal edema, intact Lochia: appropriate DVT Evaluation: No evidence of DVT seen on physical exam.  Recent Labs    10/23/21 0030 10/24/21 0605  HGB 10.4* 10.0*  HCT 29.6* 29.6*  WBC 10.3 11.4*  PLT 226 174    Assessment/Plan: 20 y.o. G1P1001 postpartum day # 1  -Continue routine postpartum care -Encouraged snug fitting bra, cold application, Tylenol PRN, and cabbage leaves for engorgement for formula feeding  -Acute blood loss anemia - hemodynamically stable and asymptomatic; start PO ferrous sulfate BID with stool softeners  -Immunization status:   all immunizations up to date   Disposition: Continue inpatient postpartum care   LOS: 1 day   12, CNM 10/24/2021, 9:01 AM   ----- 13/12/2020  Certified Nurse Midwife [facility]

## 2021-10-25 NOTE — Progress Notes (Signed)
Pt discharged with infant.  Discharge instructions, prescriptions and follow up appointment given to and reviewed with pt. Pt verbalized understanding. Escorted out by auxillary. 

## 2022-06-18 ENCOUNTER — Encounter: Payer: Self-pay | Admitting: Emergency Medicine

## 2022-06-18 ENCOUNTER — Emergency Department: Payer: Medicaid Other

## 2022-06-18 ENCOUNTER — Other Ambulatory Visit: Payer: Self-pay

## 2022-06-18 ENCOUNTER — Emergency Department
Admission: EM | Admit: 2022-06-18 | Discharge: 2022-06-18 | Disposition: A | Discharge status: Home / Self Care | Payer: Medicaid Other | Attending: Emergency Medicine | Admitting: Emergency Medicine

## 2022-06-18 DIAGNOSIS — T192XXA Foreign body in vulva and vagina, initial encounter: Secondary | ICD-10-CM | POA: Insufficient documentation

## 2022-06-18 DIAGNOSIS — N898 Other specified noninflammatory disorders of vagina: Secondary | ICD-10-CM | POA: Diagnosis present

## 2022-06-18 DIAGNOSIS — X58XXXA Exposure to other specified factors, initial encounter: Secondary | ICD-10-CM | POA: Insufficient documentation

## 2022-06-18 DIAGNOSIS — R197 Diarrhea, unspecified: Secondary | ICD-10-CM | POA: Diagnosis not present

## 2022-06-18 DIAGNOSIS — A749 Chlamydial infection, unspecified: Secondary | ICD-10-CM | POA: Diagnosis not present

## 2022-06-18 DIAGNOSIS — N939 Abnormal uterine and vaginal bleeding, unspecified: Secondary | ICD-10-CM

## 2022-06-18 LAB — CBC WITH DIFFERENTIAL/PLATELET
Abs Immature Granulocytes: 0.01 10*3/uL (ref 0.00–0.07)
Basophils Absolute: 0 10*3/uL (ref 0.0–0.1)
Basophils Relative: 1 %
Eosinophils Absolute: 0.1 10*3/uL (ref 0.0–0.5)
Eosinophils Relative: 2 %
HCT: 37 % (ref 36.0–46.0)
Hemoglobin: 11.8 g/dL — ABNORMAL LOW (ref 12.0–15.0)
Immature Granulocytes: 0 %
Lymphocytes Relative: 45 %
Lymphs Abs: 2.2 10*3/uL (ref 0.7–4.0)
MCH: 26.5 pg (ref 26.0–34.0)
MCHC: 31.9 g/dL (ref 30.0–36.0)
MCV: 83 fL (ref 80.0–100.0)
Monocytes Absolute: 0.5 10*3/uL (ref 0.1–1.0)
Monocytes Relative: 9 %
Neutro Abs: 2.1 10*3/uL (ref 1.7–7.7)
Neutrophils Relative %: 43 %
Platelets: 195 10*3/uL (ref 150–400)
RBC: 4.46 MIL/uL (ref 3.87–5.11)
RDW: 14 % (ref 11.5–15.5)
WBC: 5 10*3/uL (ref 4.0–10.5)
nRBC: 0 % (ref 0.0–0.2)

## 2022-06-18 LAB — WET PREP, GENITAL
Clue Cells Wet Prep HPF POC: NONE SEEN
Sperm: NONE SEEN
Trich, Wet Prep: NONE SEEN
WBC, Wet Prep HPF POC: >=10 — AB (ref <10)
Yeast Wet Prep HPF POC: NONE SEEN

## 2022-06-18 LAB — BASIC METABOLIC PANEL
Anion gap: 5 (ref 5–15)
BUN: 10 mg/dL (ref 6–20)
CO2: 24 mmol/L (ref 22–32)
Calcium: 8.9 mg/dL (ref 8.9–10.3)
Chloride: 111 mmol/L (ref 98–111)
Creatinine, Ser: 0.62 mg/dL (ref 0.44–1.00)
GFR, Estimated: >60 mL/min (ref >60)
Glucose, Bld: 83 mg/dL (ref 70–99)
Potassium: 3.8 mmol/L (ref 3.5–5.1)
Sodium: 140 mmol/L (ref 135–145)

## 2022-06-18 LAB — URINALYSIS, COMPLETE (UACMP) WITH MICROSCOPIC
Bilirubin Urine: NEGATIVE
Glucose, UA: NEGATIVE mg/dL
Ketones, ur: NEGATIVE mg/dL
Nitrite: NEGATIVE
Protein, ur: NEGATIVE mg/dL
Specific Gravity, Urine: 1.014 (ref 1.005–1.030)
pH: 7 (ref 5.0–8.0)

## 2022-06-18 LAB — HEPATIC FUNCTION PANEL
ALT: 24 U/L (ref 0–44)
AST: 23 U/L (ref 15–41)
Albumin: 4.1 g/dL (ref 3.5–5.0)
Alkaline Phosphatase: 56 U/L (ref 38–126)
Bilirubin, Direct: 0.1 mg/dL (ref 0.0–0.2)
Indirect Bilirubin: 0.9 mg/dL (ref 0.3–0.9)
Total Bilirubin: 1 mg/dL (ref 0.3–1.2)
Total Protein: 7.5 g/dL (ref 6.5–8.1)

## 2022-06-18 LAB — CHLAMYDIA/NGC RT PCR (ARMC ONLY)
Chlamydia Tr: DETECTED — AB
N gonorrhoeae: NOT DETECTED

## 2022-06-18 LAB — POC URINE PREG, ED: Preg Test, Ur: NEGATIVE

## 2022-06-18 MED ORDER — CEFTRIAXONE SODIUM 1 G IJ SOLR
500.0000 mg | Freq: Once | INTRAMUSCULAR | Status: AC
  Administered 2022-06-18: 500 mg via INTRAMUSCULAR
  Filled 2022-06-18: qty 10

## 2022-06-18 MED ORDER — FLUCONAZOLE 150 MG PO TABS: 150.0000 mg | ORAL_TABLET | Freq: Once | ORAL | 0 refills | Status: DC

## 2022-06-18 MED ORDER — DOXYCYCLINE HYCLATE 100 MG PO TABS: 100.0000 mg | ORAL_TABLET | Freq: Two times a day (BID) | ORAL | 0 refills | Status: DC

## 2022-06-18 MED ORDER — FLUCONAZOLE 150 MG PO TABS: 150.0000 mg | ORAL_TABLET | Freq: Once | ORAL | 0 refills | Status: AC

## 2024-01-11 ENCOUNTER — Observation Stay
Admission: EM | Admit: 2024-01-11 | Discharge: 2024-01-11 | Disposition: A | Discharge status: Home / Self Care | Payer: Medicaid Other | Attending: Obstetrics and Gynecology | Admitting: Obstetrics and Gynecology

## 2024-01-11 ENCOUNTER — Other Ambulatory Visit: Payer: Self-pay

## 2024-01-11 ENCOUNTER — Encounter: Payer: Self-pay | Admitting: Certified Nurse Midwife

## 2024-01-11 DIAGNOSIS — Z79899 Other long term (current) drug therapy: Secondary | ICD-10-CM | POA: Diagnosis not present

## 2024-01-11 DIAGNOSIS — O368120 Decreased fetal movements, second trimester, not applicable or unspecified: Secondary | ICD-10-CM | POA: Insufficient documentation

## 2024-01-11 DIAGNOSIS — O26899 Other specified pregnancy related conditions, unspecified trimester: Secondary | ICD-10-CM | POA: Clinically undetermined

## 2024-01-11 DIAGNOSIS — Z3A26 26 weeks gestation of pregnancy: Secondary | ICD-10-CM | POA: Diagnosis not present

## 2024-01-11 DIAGNOSIS — S3991XA Unspecified injury of abdomen, initial encounter: Secondary | ICD-10-CM | POA: Insufficient documentation

## 2024-01-11 DIAGNOSIS — O4702 False labor before 37 completed weeks of gestation, second trimester: Principal | ICD-10-CM | POA: Insufficient documentation

## 2024-01-11 DIAGNOSIS — O9A212 Injury, poisoning and certain other consequences of external causes complicating pregnancy, second trimester: Secondary | ICD-10-CM | POA: Insufficient documentation

## 2024-01-11 DIAGNOSIS — O479 False labor, unspecified: Principal | ICD-10-CM | POA: Clinically undetermined

## 2024-01-11 DIAGNOSIS — O368190 Decreased fetal movements, unspecified trimester, not applicable or unspecified: Secondary | ICD-10-CM | POA: Clinically undetermined

## 2024-01-11 LAB — CBC
HCT: 32 % — ABNORMAL LOW (ref 36.0–46.0)
Hemoglobin: 11.3 g/dL — ABNORMAL LOW (ref 12.0–15.0)
MCH: 29 pg (ref 26.0–34.0)
MCHC: 35.3 g/dL (ref 30.0–36.0)
MCV: 82.1 fL (ref 80.0–100.0)
Platelets: 244 10*3/uL (ref 150–400)
RBC: 3.9 MIL/uL (ref 3.87–5.11)
RDW: 13.2 % (ref 11.5–15.5)
WBC: 11 10*3/uL — ABNORMAL HIGH (ref 4.0–10.5)
nRBC: 0 % (ref 0.0–0.2)

## 2024-01-11 LAB — URINALYSIS, ROUTINE W REFLEX MICROSCOPIC
Bilirubin Urine: NEGATIVE
Glucose, UA: NEGATIVE mg/dL
Hgb urine dipstick: NEGATIVE
Ketones, ur: 5 mg/dL — AB
Leukocytes,Ua: NEGATIVE
Nitrite: NEGATIVE
Protein, ur: NEGATIVE mg/dL
Specific Gravity, Urine: 1.001 — ABNORMAL LOW (ref 1.005–1.030)
pH: 7 (ref 5.0–8.0)

## 2024-01-11 LAB — WET PREP, GENITAL
Clue Cells Wet Prep HPF POC: NONE SEEN
Sperm: NONE SEEN
Trich, Wet Prep: NONE SEEN
WBC, Wet Prep HPF POC: <10 (ref <10)
Yeast Wet Prep HPF POC: NONE SEEN

## 2024-01-11 MED ORDER — ACETAMINOPHEN 500 MG PO TABS: 1000.0000 mg | ORAL_TABLET | Freq: Four times a day (QID) | ORAL | Status: DC | PRN

## 2024-01-11 MED ORDER — ACETAMINOPHEN 500 MG PO TABS: 1000.0000 mg | ORAL_TABLET | Freq: Once | ORAL | Status: DC

## 2024-01-11 MED ORDER — LACTATED RINGERS IV BOLUS
1000.0000 mL | Freq: Once | INTRAVENOUS | Status: AC
  Administered 2024-01-11: 1000 mL via INTRAVENOUS
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
## 2024-01-23 ENCOUNTER — Telehealth: Payer: Self-pay | Admitting: Licensed Clinical Social Worker

## 2024-01-23 NOTE — Telephone Encounter (Signed)
Patient called requesting services for depression while pregnant. Patient would need virtual services and wants to know if she can sign consent over Zoom. LCSW informed patient that she would need to check.

## 2024-01-28 NOTE — Telephone Encounter (Signed)
LCSW spoke with patient and scheduled virtual appt. Patient plans to come in to sign [facility] consent while at [facility] appointment on 02/07/24.

## 2024-02-11 ENCOUNTER — Telehealth: Payer: Self-pay | Admitting: Licensed Clinical Social Worker

## 2024-02-11 ENCOUNTER — Ambulatory Visit: Payer: Medicaid Other | Admitting: Licensed Clinical Social Worker

## 2024-02-11 NOTE — Telephone Encounter (Signed)
 Patient did not show to scheduled appointment via Zoom. LCSW called patient and lvm regarding the appointment.   LCSW also notes that it has not been confirmed if patient came in to sign [facility] consent.
